# Patient Record
Sex: Male | Born: 2000 | Hispanic: Yes | Marital: Single | Smoking: Never smoker
Health system: Southern US, Community
[De-identification: ages and names within clinical notes are randomized; demographics above are authoritative.]

## PROBLEM LIST (undated history)

## (undated) DIAGNOSIS — Z789 Other specified health status: Secondary | ICD-10-CM

## (undated) HISTORY — PX: NO PAST SURGERIES: SHX2092

---
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
## 2020-11-07 ENCOUNTER — Encounter: Payer: Self-pay | Admitting: Family Medicine

## 2020-11-07 ENCOUNTER — Ambulatory Visit (INDEPENDENT_AMBULATORY_CARE_PROVIDER_SITE_OTHER): Payer: Medicaid Other | Admitting: Family Medicine

## 2020-11-07 ENCOUNTER — Other Ambulatory Visit: Payer: Self-pay

## 2020-11-07 VITALS — BP 119/73 | HR 71 | Ht 69.0 in | Wt 133.0 lb

## 2020-11-07 DIAGNOSIS — Z1159 Encounter for screening for other viral diseases: Secondary | ICD-10-CM

## 2020-11-07 DIAGNOSIS — Z114 Encounter for screening for human immunodeficiency virus [HIV]: Secondary | ICD-10-CM | POA: Diagnosis not present

## 2020-11-07 DIAGNOSIS — Z23 Encounter for immunization: Secondary | ICD-10-CM | POA: Diagnosis not present

## 2020-11-07 NOTE — Patient Instructions (Signed)
It was great meeting you today!  I am glad you are doing well!   Please follow up at your next scheduled appointment in 6 months, if anything arises between now and then, please don't hesitate to contact our office.   Thank you for allowing [place] to be a part of your medical care!  Thank you, Dr. Robyne Peers

## 2020-11-07 NOTE — Progress Notes (Signed)
    SUBJECTIVE:   CHIEF COMPLAINT / HPI:   Patient presents to the clinic in order to establish care. He is unsure of where he was seen by last or who his last PCP was, states that he will need to ask his mother. He is from [place]. Past medical history significant for seasonal allergies. Currently not taking any medications. Denies any surgeries or recent hospitalizations. Denies any drug allergies. Non-contributory family history. Eats a balanced diet and exercises daily by going to the gym. Denies illicit drug and tobacco use. Drinks about 2 cups of alcohol when going out about 2 times a week. Graduated high school a year ago and taking a break currently, long-term goals include wanting to be a Paediatric nurse. Lives with 2 older brothers who are 6 and 44 years old. Currently has a strong support system in place which includes his brothers and parents. Reports that he is sexually active with 2 male partners, uses protection which each and every encounter.   Denies any current concerns. Denies chest pain, dyspnea, palpitations, swelling, issues surrounding mood or any other symptoms.  OBJECTIVE:   BP 119/73   Pulse 71   Ht 5\' 9"  (1.753 m)   Wt 133 lb (60.3 kg)   SpO2 97%   BMI 19.64 kg/m   General: Patient well-appearing, in no acute distress. HEENT: normocephalic, atraumatic, PERRLA, non-tender thyroid, no cervical LAD, normal nasal cavity, no buccal ulcers or erythema CV: RRR, no murmurs or gallops auscultated Resp: CTAB, no wheezing, rales or rhonchi Abdomen: soft, non-tender, nondistended, presence of bowel sounds Ext: radial and distal pulses strong and equal bilaterally, no LE edema noted bilaterally, capillary refill less than 2 sec Neuro: normal gait Psych: mood appropriate   ASSESSMENT/PLAN:   Encounter for screening for HIV -pending HIV and Hep C screening, will notify patient of any abnormal results -safe sex practices discussed and encouraged to continue, consider  GC/Chlamydia testing at next visit.    -PHQ-9 score of 0 reviewed.   , DO [facility]

## 2020-11-07 NOTE — Assessment & Plan Note (Addendum)
-  pending HIV and Hep C screening, will notify patient of any abnormal results -safe sex practices discussed and encouraged to continue, consider GC/Chlamydia testing at next visit.

## 2020-11-08 LAB — HIV ANTIBODY (ROUTINE TESTING W REFLEX): HIV Screen 4th Generation wRfx: NONREACTIVE

## 2020-11-08 LAB — HCV INTERPRETATION

## 2020-11-08 LAB — HCV AB W REFLEX TO QUANT PCR: HCV Ab: <0.1 s/co ratio (ref 0.0–0.9)

## 2020-11-21 DIAGNOSIS — J342 Deviated nasal septum: Secondary | ICD-10-CM | POA: Diagnosis not present

## 2020-11-21 DIAGNOSIS — R519 Headache, unspecified: Secondary | ICD-10-CM | POA: Diagnosis not present

## 2020-11-21 DIAGNOSIS — J309 Allergic rhinitis, unspecified: Secondary | ICD-10-CM | POA: Diagnosis not present

## 2020-11-21 DIAGNOSIS — J343 Hypertrophy of nasal turbinates: Secondary | ICD-10-CM | POA: Diagnosis not present

## 2021-03-20 DIAGNOSIS — J342 Deviated nasal septum: Secondary | ICD-10-CM | POA: Diagnosis not present

## 2021-03-20 DIAGNOSIS — J343 Hypertrophy of nasal turbinates: Secondary | ICD-10-CM | POA: Diagnosis not present

## 2023-09-18 ENCOUNTER — Ambulatory Visit (INDEPENDENT_AMBULATORY_CARE_PROVIDER_SITE_OTHER): Admitting: Student

## 2023-09-18 ENCOUNTER — Encounter: Payer: Self-pay | Admitting: Student

## 2023-09-18 VITALS — BP 110/72 | HR 83 | Ht 71.0 in | Wt 140.8 lb

## 2023-09-18 DIAGNOSIS — K4090 Unilateral inguinal hernia, without obstruction or gangrene, not specified as recurrent: Secondary | ICD-10-CM | POA: Diagnosis not present

## 2023-09-18 DIAGNOSIS — Z23 Encounter for immunization: Secondary | ICD-10-CM | POA: Diagnosis not present

## 2023-09-18 NOTE — Patient Instructions (Signed)
 Groin Hernia (Inguinal Hernia) in Adults: What to Know  A hernia happens when an organ or tissue in your body pushes out through a weak spot in the muscles of your belly. This makes a bulge. A groin hernia is also called an inguinal hernia. It's found in your groin, which is the area where your leg meets your lower belly. This kind of hernia could also be: In your scrotum, if you're male. In the folds of skin around your vagina, if you're male. You may be able to push the bulge back into your belly. If you can't push it in and blood flow is cut off to the hernia, you'll need surgery right away. What are the causes? A groin hernia may happen when you strain your belly muscles, such as when you: Lift a heavy object. Strain to poop. Cough. What increases the risk? You may be more likely to get a groin hernia if: You're male. You're 50 years or older. You're pregnant. You've had a groin hernia or belly surgery before. You smoke. You're overweight. You work at a job where you need to stand a lot or lift heavy things. What are the signs or symptoms? Symptoms may depend on how big the hernia is. If it's small, you may not have symptoms. If it's bigger, you may have: A bulge near your groin or genitals. Pain or burning in your groin. A dull ache or feeling of pressure in your groin. If blood flow is cut off to the tissues inside the hernia, you may also: Feel pain and tenderness when you touch the bulge. The skin over it may turn red or purple. Have a fever. Throw up or feel like you may throw up. Have trouble pooping or passing gas. How is this treated? Treatment depends on how big the hernia is and what symptoms you have. You may need: To be watched to see if the bulge grows bigger. Surgery. This may be done if the hernia is big or if you have symptoms. Follow these instructions at home: Lifestyle Ask if it's OK for you to lift. Try not to stand for long periods of time. Do not  smoke, vape, or use nicotine or tobacco. Stay at a healthy weight. Try not to do things that put pressure on your hernia. Preventing trouble pooping You may need to take these steps to help prevent or treat trouble pooping (constipation): Take medicines to help you poop. Eat foods high in fiber, like beans, whole grains, and fresh fruits and vegetables. Drink more fluids as told. General instructions Try to push the hernia back in place by very gently pressing on it while lying down. Do not try to force it back in if it won't push in easily. Watch your hernia for any changes in: Shape. Size. Color. Take your medicines only as told. Contact a doctor if: You have a fever. You have new symptoms. Your symptoms get worse. You can't poop or pass gas. Get help right away if: Your bulge: Starts to hurt a lot. Changes color. You have sudden pain in your scrotum, or your scrotum changes size. You can't gently push the hernia back in place. You feel like you may vomit, and that feeling does not go away. You keep throwing up or feeling like you need to throw up. These symptoms may be an emergency. Call 911 right away. Do not wait to see if the symptoms will go away. Do not drive yourself to the hospital. This information is  not intended to replace advice given to you by your health care provider. Make sure you discuss any questions you have with your health care provider. Document Revised: 11/29/2022 Document Reviewed: 11/29/2022 Elsevier Patient Education  2024 [employer].

## 2023-09-18 NOTE — Telephone Encounter (Signed)
 Patient contacted clinic and expressed he will like to get a Gen surgery referral for inguinal hernia after his visit today. Referral placed for general surgery

## 2023-09-18 NOTE — Progress Notes (Signed)
    SUBJECTIVE:   CHIEF COMPLAINT / HPI:   23 year old male with no significant past medical history presenting due to concerns of lower abdominal lump.  This lump usually is present intermittently for the past 1 year he about once a month.  He said he noticed the lump more after eating heavy meal or while carrying boxes.  This usually present on the left side just above his scrotum and spontaneously resolve within minutes to an hour. Had one time episode of brief pain but for the most part it is usually painless.  PERTINENT  PMH / PSH: Reviewed   OBJECTIVE:   BP 110/72   Pulse 83   Ht 5\' 11"  (1.803 m)   Wt 140 lb 12.8 oz (63.9 kg)   SpO2 100%   BMI 19.64 kg/m    Physical Exam General: Alert, well appearing, NAD Cardiovascular: Regular rate, well-perfused Respiratory: Normal work of breathing on RA Pelvic exam: No notable inguinal mass,  no penile lesion,  normal testes, or hydrocele.  RN Alisa App served as Biomedical engineer for this exam.  ASSESSMENT/PLAN:   Inguinal hernia of left side without obstruction or gangrene No visible inguinal mass on exam.  Patient's presentation more consistent with non-incarcerated inguinal hernia.  No red flag symptoms at this time.  Via shared decision will monitor closely at this time. - Advised patient on avoiding constipation and maintain adequate hydration -Avoid heavy lifting -Consider general surgery referral if presentation is more frequent or painful - ED precaution discussed with patient     Goble Last, MD [facility]

## 2023-09-18 NOTE — Assessment & Plan Note (Addendum)
 No visible inguinal mass on exam.  Patient's presentation more consistent with non-incarcerated inguinal hernia.  No red flag symptoms at this time.  Via shared decision will monitor closely at this time. - Advised patient on avoiding constipation and maintain adequate hydration -Avoid heavy lifting -Consider general surgery referral if presentation is more frequent or painful - ED precaution discussed with patient

## 2023-11-17 ENCOUNTER — Other Ambulatory Visit: Payer: Self-pay

## 2023-11-17 ENCOUNTER — Encounter (HOSPITAL_COMMUNITY): Payer: Self-pay

## 2023-11-17 ENCOUNTER — Emergency Department (HOSPITAL_COMMUNITY)
Admission: EM | Admit: 2023-11-17 | Discharge: 2023-11-17 | Disposition: A | Discharge status: Home / Self Care | Attending: Emergency Medicine | Admitting: Emergency Medicine

## 2023-11-17 DIAGNOSIS — K4090 Unilateral inguinal hernia, without obstruction or gangrene, not specified as recurrent: Secondary | ICD-10-CM | POA: Insufficient documentation

## 2023-11-17 NOTE — ED Triage Notes (Signed)
 Patient arrives POV c/o left sided groin pain. Patient states he was evaluated at his PCP ~ 2 months ago and was told he has an inguinal hernia. Patient reports pain has now increased; states he does heavy lifting at work. Patient also states he has not heard back from referral for surgical tx.

## 2023-11-17 NOTE — Discharge Instructions (Addendum)
 Please make an appointment with the general surgery group listed.  Please return for new or worsening symptoms.

## 2023-11-17 NOTE — ED Provider Notes (Signed)
 [facility] Emergency Department Provider Note MRN:  [id number]  Arrival date & time: 11/17/23     Chief Complaint   Hernia   History of Present Illness   Troy Romero is a 23 y.o. year-old male presents to the ED with chief complaint of left inguinal hernia.  He states that he was having some pain tonight, but the symptoms have now resolved.  He states that he asked his doctor for referral, but has not been contacted by the surgery office yet.  He states that because he is feeling better, he would like to go home and follow-up outpatient.  He asks if I can give him contact information.  He denies fever, chills, nausea, vomiting.  States that he thinks he had more pain tonight because he had been drinking and felt bloated.  But again, he states that he feels better now..  History provided by patient.   Review of Systems  Pertinent positive and negative review of systems noted in HPI.    Physical Exam   Vitals:   11/17/23 0420  BP: 121/81  Pulse: 97  Resp: 16  Temp: 98.8 F (37.1 C)  SpO2: 99%    CONSTITUTIONAL:  non toxic-appearing, NAD NEURO:  Alert and oriented x 3, CN 3-12 grossly intact EYES:  eyes equal and reactive ENT/NECK:  Supple, no stridor  CARDIO:  normal rate, regular rhythm, appears well-perfused  PULM:  No respiratory distress,  GI/GU:  non-distended, normal BSx4, no palpable mass or hernia, no induration or erythema MSK/SPINE:  No gross deformities, no edema, moves all extremities  SKIN:  no rash, atraumatic   *Additional and/or pertinent findings included in MDM below  Diagnostic and Interventional Summary    EKG Interpretation Date/Time:    Ventricular Rate:    PR Interval:    QRS Duration:    QT Interval:    QTC Calculation:   R Axis:      Text Interpretation:         Labs Reviewed - No data to display  No orders to display    Medications - No data to display   Procedures  /  Critical  Care Procedures  ED Course and Medical Decision Making  I have reviewed the triage vital signs, the nursing notes, and pertinent available records from the EMR.  Social Determinants Affecting Complexity of Care: Patient has no clinically significant social determinants affecting this chief complaint..   ED Course:    Medical Decision Making Patient here with history of inguinal hernia.  He had some pain tonight, but his symptoms have resolved.  I do not see any worrisome findings on my exam.  Vital signs are stable.  He looks well.  I offered the patient advanced imaging versus discharge and referral.  Patient would like to be discharged with contact information for general surgery.  I will provide this to him.  Return precautions discussed.  Patient understands agrees with the plan.         Consultants: No consultations were needed in caring for this patient.   Treatment and Plan: Emergency department workup does not suggest an emergent condition requiring admission or immediate intervention beyond  what has been performed at this time. The patient is safe for discharge and has  been instructed to return immediately for worsening symptoms, change in  symptoms or any other concerns    Final Clinical Impressions(s) / ED Diagnoses     ICD-10-CM   1. Left inguinal hernia  K40.90       ED Discharge Orders     None         Discharge Instructions Discussed with and Provided to Patient:     Discharge Instructions      Please make an appointment with the general surgery group listed.       Vicky Charleston, PA-C 11/17/23 [id number]    Jerral Meth, MD 11/17/23 [phone]

## 2023-11-20 ENCOUNTER — Ambulatory Visit: Payer: Self-pay | Admitting: General Surgery

## 2023-11-20 DIAGNOSIS — K4090 Unilateral inguinal hernia, without obstruction or gangrene, not specified as recurrent: Secondary | ICD-10-CM | POA: Diagnosis not present

## 2023-12-14 NOTE — Progress Notes (Signed)
 Spanish Interpreter refused by patient. He signed an Oncologist Vaccine received:  []  No [x]  Yes Date of any COVID positive Test in last 90 days: none  PCP - Norleen April MD at [facility] Cardiologist - none  Chest x-ray - 04-17-2017  2v  Epic EKG -  04-18-2017  Epic   12-17-2023   Stress Test -  ECHO -  Cardiac Cath -  CT Coronary Calcium score:   Bowel Prep - [x]  No  []   Yes ______  Pacemaker / ICD device [x]  No []  Yes   Spinal Cord Stimulator:[x]  No []  Yes       History of Sleep Apnea? [x]  No []  Yes   CPAP used?- [x]  No []  Yes    Patient has: [x]  NO Hx DM   []  Pre-DM   []  DM1  []   DM2 Does the patient monitor blood sugar?   [x]  N/A   []  No []  Yes   Blood Thinner / Instructions:  none Aspirin Instructions:   None  ERAS Protocol Ordered: []  No  [x]  Yes PRE-SURGERY []  ENSURE  []  G2   [x]  No Drink Ordered Patient is to be NPO after: 1100  Dental hx: []  Dentures:  [x]  N/A      []  Bridge or Partial:                   []  Loose or Damaged teeth:   Activity level: Able to walk up 2 flights of stairs without becoming significantly short of breath or having chest pain?  []  No   [x]    Yes  Patient can perform ADLs without assistance. []  No   [x]   Yes  Anesthesia review: Vapes- some days, 2019 EKG- ? Right ventricular hypertrophy- had CP; will get repeat at PST  Patient denies any S&S of respiratory illness or Covid - no shortness of breath, fever, cough or chest pain at PAT appointment.  Patient verbalized understanding and agreement to the Pre-Surgical Instructions that were given to them at this PAT appointment. Patient was also educated of the need to review these PAT instructions again prior to his surgery.I reviewed the appropriate phone numbers to call if they have any and questions or concerns.

## 2023-12-14 NOTE — Patient Instructions (Signed)
 SURGICAL WAITING ROOM VISITATION Patients having surgery or a procedure may have no more than 2 support people in the waiting area - these visitors may rotate in the visitor waiting room.   If the patient needs to stay at the hospital during part of their recovery, the visitor guidelines for inpatient rooms apply.  PRE-OP VISITATION  Pre-op nurse will coordinate an appropriate time for 1 support person to accompany the patient in pre-op.  This support person may not rotate.  This visitor will be contacted when the time is appropriate for the visitor to come back in the pre-op area.  Please refer to the [facility] website for the visitor guidelines for Inpatients (after your surgery is over and you are in a regular room).  You are not required to quarantine at this time prior to your surgery. However, you must do this: Hand Hygiene often Do NOT share personal items Notify your provider if you are in close contact with someone who has COVID or you develop fever 100.4 or greater, new onset of sneezing, cough, sore throat, shortness of breath or body aches.  If you test positive for Covid or have been in contact with anyone that has tested positive in the last 10 days please notify you surgeon.    Your procedure is scheduled on:  MONDAY  December 30, 2023  Report to [facility] Main Entrance: [address] entrance where the [employer] is available.   Report to admitting at: 11:45 AM  Call this number if you have any questions or problems the morning of surgery [phone]  FOLLOW ANY ADDITIONAL PRE OP INSTRUCTIONS YOU RECEIVED FROM YOUR SURGEON'S OFFICE!!!  Do not eat food after Midnight the night prior to your surgery/procedure.  After Midnight you may have the following liquids until  11:00  AM DAY OF SURGERY  Clear Liquid Diet Water Black Coffee (sugar ok, NO MILK/CREAM OR CREAMERS)  Tea (sugar ok, NO MILK/CREAM OR CREAMERS) regular and decaf                              Plain Jell-O  with no fruit (NO RED)                                           Fruit ices (not with fruit pulp, NO RED)                                     Popsicles (NO RED)                                                                  Juice: NO CITRUS JUICES: only apple, WHITE grape, WHITE cranberry Sports drinks like Gatorade or Powerade (NO RED)                Oral Hygiene is also important to reduce your risk of infection.        Remember - BRUSH YOUR TEETH THE MORNING OF SURGERY WITH YOUR REGULAR TOOTHPASTE  Do NOT smoke after Midnight  the night before surgery.  STOP TAKING all Vitamins, Herbs and supplements 1 week before your surgery.   Take ONLY these medicines the morning of surgery with A SIP OF WATER: Flonase nasal spray if needed.                    You may not have any metal on your body including jewelry, and body piercing  Do not wear lotions, powders, cologne, or deodorant  Men may shave face and neck.  Contacts, Hearing Aids, dentures or bridgework may not be worn into surgery. DENTURES WILL BE REMOVED PRIOR TO SURGERY PLEASE DO NOT APPLY Poly grip OR ADHESIVES!!!  Patients discharged on the day of surgery will not be allowed to drive home.  Someone NEEDS to stay with you for the first 24 hours after anesthesia.  Do not bring your home medications to the hospital. The Pharmacy will dispense medications listed on your medication list to you during your admission in the Hospital.   Please read over the following fact sheets you were given: IF YOU HAVE QUESTIONS ABOUT YOUR PRE-OP INSTRUCTIONS, PLEASE CALL [phone]   [facility] - Preparing for Surgery Before surgery, you can play an important role.  Because skin is not sterile, your skin needs to be as free of germs as possible.  You can reduce the number of germs on your skin by washing with CHG (chlorahexidine gluconate) soap before surgery.  CHG is an antiseptic cleaner which kills germs and bonds with  the skin to continue killing germs even after washing. Please DO NOT use if you have an allergy to CHG or antibacterial soaps.  If your skin becomes reddened/irritated stop using the CHG and inform your nurse when you arrive at Short Stay. Do not shave (including legs and underarms) for at least 48 hours prior to the first CHG shower.  You may shave your face/neck.  Please follow these instructions carefully:  1.  Shower with CHG Soap the night before surgery and the  morning of surgery.  2.  If you choose to wash your hair, wash your hair first as usual with your normal  shampoo.  3.  After you shampoo, rinse your hair and body thoroughly to remove the shampoo.                             4.  Use CHG as you would any other liquid soap.  You can apply chg directly to the skin and wash.  Gently with a scrungie or clean washcloth.  5.  Apply the CHG Soap to your body ONLY FROM THE NECK DOWN.   Do not use on face/ open                           Wound or open sores. Avoid contact with eyes, ears mouth and genitals (private parts).                       Wash face,  Genitals (private parts) with your normal soap.             6.  Wash thoroughly, paying special attention to the area where your  surgery  will be performed.  7.  Thoroughly rinse your body with warm water from the neck down.  8.  DO NOT shower/wash with your normal soap after using and rinsing off the  CHG Soap.            9.  Pat yourself dry with a clean towel.            10.  Wear clean pajamas.            11.  Place clean sheets on your bed the night of your first shower and do not  sleep with pets.  ON THE DAY OF SURGERY : Do not apply any lotions/deodorants the morning of surgery.  Please wear clean clothes to the hospital/surgery center.   FAILURE TO FOLLOW THESE INSTRUCTIONS MAY RESULT IN THE CANCELLATION OF YOUR SURGERY  PATIENT SIGNATURE_________________________________  NURSE  SIGNATURE__________________________________  ________________________________________________________________________

## 2023-12-17 ENCOUNTER — Encounter (HOSPITAL_COMMUNITY): Payer: Self-pay

## 2023-12-17 ENCOUNTER — Other Ambulatory Visit: Payer: Self-pay

## 2023-12-17 ENCOUNTER — Encounter (HOSPITAL_COMMUNITY)
Admission: RE | Admit: 2023-12-17 | Discharge: 2023-12-17 | Disposition: A | Discharge status: Home / Self Care | Source: Ambulatory Visit | Attending: General Surgery | Admitting: General Surgery

## 2023-12-17 VITALS — BP 115/73 | HR 64 | Temp 97.6°F | Resp 14 | Ht 71.0 in | Wt 140.0 lb

## 2023-12-17 DIAGNOSIS — R9431 Abnormal electrocardiogram [ECG] [EKG]: Secondary | ICD-10-CM | POA: Diagnosis not present

## 2023-12-17 DIAGNOSIS — K4090 Unilateral inguinal hernia, without obstruction or gangrene, not specified as recurrent: Secondary | ICD-10-CM | POA: Diagnosis not present

## 2023-12-17 DIAGNOSIS — Z01818 Encounter for other preprocedural examination: Secondary | ICD-10-CM | POA: Insufficient documentation

## 2023-12-17 HISTORY — DX: Other specified health status: Z78.9

## 2023-12-17 LAB — BASIC METABOLIC PANEL WITH GFR
Anion gap: 11 (ref 5–15)
BUN: 16 mg/dL (ref 6–20)
CO2: 24 mmol/L (ref 22–32)
Calcium: 9.2 mg/dL (ref 8.9–10.3)
Chloride: 105 mmol/L (ref 98–111)
Creatinine, Ser: 0.81 mg/dL (ref 0.61–1.24)
GFR, Estimated: >60 mL/min (ref >60)
Glucose, Bld: 88 mg/dL (ref 70–99)
Potassium: 4.4 mmol/L (ref 3.5–5.1)
Sodium: 139 mmol/L (ref 135–145)

## 2023-12-17 LAB — CBC
HCT: 45.3 % (ref 39.0–52.0)
Hemoglobin: 15.3 g/dL (ref 13.0–17.0)
MCH: 30.4 pg (ref 26.0–34.0)
MCHC: 33.8 g/dL (ref 30.0–36.0)
MCV: 90.1 fL (ref 80.0–100.0)
Platelets: 270 K/uL (ref 150–400)
RBC: 5.03 MIL/uL (ref 4.22–5.81)
RDW: 12.3 % (ref 11.5–15.5)
WBC: 5 K/uL (ref 4.0–10.5)
nRBC: 0 % (ref 0.0–0.2)

## 2023-12-29 NOTE — Anesthesia Preprocedure Evaluation (Addendum)
 Anesthesia Evaluation  Patient identified by MRN, date of birth, ID band Patient awake    Reviewed: Allergy & Precautions, NPO status , Patient's Chart, lab work & pertinent test results  Airway Mallampati: I  TM Distance: >3 FB Neck ROM: Full    Dental no notable dental hx. (+) Dental Advisory Given, Teeth Intact   Pulmonary neg pulmonary ROS   Pulmonary exam normal breath sounds clear to auscultation       Cardiovascular negative cardio ROS Normal cardiovascular exam Rhythm:Regular Rate:Normal     Neuro/Psych negative neurological ROS     GI/Hepatic negative GI ROS, Neg liver ROS,,,  Endo/Other  negative endocrine ROS    Renal/GU negative Renal ROS     Musculoskeletal negative musculoskeletal ROS (+)    Abdominal   Peds  Hematology negative hematology ROS (+)   Anesthesia Other Findings   Reproductive/Obstetrics                              Anesthesia Physical Anesthesia Plan  ASA: 2  Anesthesia Plan: General   Post-op Pain Management: Tylenol  PO (pre-op)*, Gabapentin  PO (pre-op)* and Regional block*   Induction: Intravenous  PONV Risk Score and Plan: 3 and Ondansetron , Dexamethasone , Treatment may vary due to age or medical condition and Midazolam   Airway Management Planned: Oral ETT  Additional Equipment: None  Intra-op Plan:   Post-operative Plan: Extubation in OR  Informed Consent: I have reviewed the patients History and Physical, chart, labs and discussed the procedure including the risks, benefits and alternatives for the proposed anesthesia with the patient or authorized representative who has indicated his/her understanding and acceptance.     Dental advisory given  Plan Discussed with: CRNA  Anesthesia Plan Comments:          Anesthesia Quick Evaluation

## 2023-12-30 ENCOUNTER — Ambulatory Visit (HOSPITAL_BASED_OUTPATIENT_CLINIC_OR_DEPARTMENT_OTHER): Payer: Self-pay | Admitting: Certified Registered Nurse Anesthetist

## 2023-12-30 ENCOUNTER — Encounter (HOSPITAL_COMMUNITY): Payer: Self-pay | Admitting: General Surgery

## 2023-12-30 ENCOUNTER — Ambulatory Visit (HOSPITAL_COMMUNITY): Payer: Self-pay | Admitting: Certified Registered Nurse Anesthetist

## 2023-12-30 ENCOUNTER — Other Ambulatory Visit: Payer: Self-pay

## 2023-12-30 ENCOUNTER — Ambulatory Visit (HOSPITAL_COMMUNITY)
Admission: RE | Admit: 2023-12-30 | Discharge: 2023-12-30 | Disposition: A | Discharge status: Home / Self Care | Attending: General Surgery | Admitting: General Surgery

## 2023-12-30 ENCOUNTER — Encounter (HOSPITAL_COMMUNITY)
Admission: RE | Disposition: A | Discharge status: Home / Self Care | Payer: Self-pay | Source: Home / Self Care | Attending: General Surgery

## 2023-12-30 DIAGNOSIS — K4090 Unilateral inguinal hernia, without obstruction or gangrene, not specified as recurrent: Secondary | ICD-10-CM | POA: Insufficient documentation

## 2023-12-30 DIAGNOSIS — G8918 Other acute postprocedural pain: Secondary | ICD-10-CM | POA: Diagnosis not present

## 2023-12-30 HISTORY — PX: XI ROBOTIC ASSISTED INGUINAL HERNIA REPAIR WITH MESH: SHX6706

## 2023-12-30 SURGERY — REPAIR, HERNIA, INGUINAL, ROBOT-ASSISTED, LAPAROSCOPIC, USING MESH
Anesthesia: General | Site: Inguinal | Laterality: Left

## 2023-12-30 MED ORDER — CEFAZOLIN SODIUM-DEXTROSE 2-4 GM/100ML-% IV SOLN
2.0000 g | INTRAVENOUS | Status: AC
  Administered 2023-12-30: 2 g via INTRAVENOUS
  Filled 2023-12-30: qty 100

## 2023-12-30 MED ORDER — GABAPENTIN 300 MG PO CAPS
300.0000 mg | ORAL_CAPSULE | ORAL | Status: AC
  Administered 2023-12-30: 300 mg via ORAL
  Filled 2023-12-30: qty 1

## 2023-12-30 MED ORDER — OXYCODONE HCL 5 MG PO TABS
ORAL_TABLET | ORAL | Status: AC
  Filled 2023-12-30: qty 1

## 2023-12-30 MED ORDER — LACTATED RINGERS IV SOLN: INTRAVENOUS | Status: DC | PRN

## 2023-12-30 MED ORDER — ACETAMINOPHEN 500 MG PO TABS
1000.0000 mg | ORAL_TABLET | ORAL | Status: AC
  Administered 2023-12-30: 1000 mg via ORAL
  Filled 2023-12-30: qty 2

## 2023-12-30 MED ORDER — LACTATED RINGERS IV SOLN: INTRAVENOUS | Status: DC

## 2023-12-30 MED ORDER — ROCURONIUM BROMIDE 100 MG/10ML IV SOLN
INTRAVENOUS | Status: DC | PRN
  Administered 2023-12-30: 70 mg via INTRAVENOUS
  Administered 2023-12-30: 20 mg via INTRAVENOUS
  Administered 2023-12-30: 10 mg via INTRAVENOUS

## 2023-12-30 MED ORDER — ROPIVACAINE HCL 5 MG/ML IJ SOLN
INTRAMUSCULAR | Status: DC | PRN
Start: 2023-12-30 — End: 2023-12-30
  Administered 2023-12-30: 30 mL via PERINEURAL

## 2023-12-30 MED ORDER — SUGAMMADEX SODIUM 200 MG/2ML IV SOLN
INTRAVENOUS | Status: AC
  Filled 2023-12-30: qty 2

## 2023-12-30 MED ORDER — CHLORHEXIDINE GLUCONATE 0.12 % MT SOLN
15.0000 mL | Freq: Once | OROMUCOSAL | Status: AC
  Administered 2023-12-30: 15 mL via OROMUCOSAL

## 2023-12-30 MED ORDER — DEXAMETHASONE SODIUM PHOSPHATE 10 MG/ML IJ SOLN
INTRAMUSCULAR | Status: AC
  Filled 2023-12-30: qty 1

## 2023-12-30 MED ORDER — ORAL CARE MOUTH RINSE: 15.0000 mL | Freq: Once | OROMUCOSAL | Status: AC

## 2023-12-30 MED ORDER — OXYCODONE HCL 5 MG/5ML PO SOLN: 5.0000 mg | Freq: Once | ORAL | Status: AC | PRN

## 2023-12-30 MED ORDER — CLONIDINE HCL (ANALGESIA) 100 MCG/ML EP SOLN
EPIDURAL | Status: DC | PRN
Start: 2023-12-30 — End: 2023-12-30
  Administered 2023-12-30: 80 ug

## 2023-12-30 MED ORDER — ROCURONIUM BROMIDE 10 MG/ML (PF) SYRINGE
PREFILLED_SYRINGE | INTRAVENOUS | Status: AC
  Filled 2023-12-30: qty 10

## 2023-12-30 MED ORDER — HYDROMORPHONE HCL 1 MG/ML IJ SOLN: 0.2500 mg | INTRAMUSCULAR | Status: DC | PRN

## 2023-12-30 MED ORDER — PROPOFOL 10 MG/ML IV BOLUS
INTRAVENOUS | Status: DC | PRN
  Administered 2023-12-30: 200 mg via INTRAVENOUS

## 2023-12-30 MED ORDER — FENTANYL CITRATE PF 50 MCG/ML IJ SOSY
100.0000 ug | PREFILLED_SYRINGE | INTRAMUSCULAR | Status: DC
  Filled 2023-12-30: qty 2

## 2023-12-30 MED ORDER — DROPERIDOL 2.5 MG/ML IJ SOLN: 0.6250 mg | Freq: Once | INTRAMUSCULAR | Status: DC | PRN

## 2023-12-30 MED ORDER — GABAPENTIN 300 MG PO CAPS
300.0000 mg | ORAL_CAPSULE | Freq: Once | ORAL | Status: DC
Start: 2023-12-30 — End: 2023-12-30

## 2023-12-30 MED ORDER — CHLORHEXIDINE GLUCONATE CLOTH 2 % EX PADS: 6.0000 | MEDICATED_PAD | Freq: Once | CUTANEOUS | Status: DC

## 2023-12-30 MED ORDER — MIDAZOLAM HCL 2 MG/2ML IJ SOLN
INTRAMUSCULAR | Status: AC
  Filled 2023-12-30: qty 2

## 2023-12-30 MED ORDER — ACETAMINOPHEN 500 MG PO TABS: 1000.0000 mg | ORAL_TABLET | Freq: Once | ORAL | Status: DC

## 2023-12-30 MED ORDER — DEXMEDETOMIDINE HCL IN NACL 80 MCG/20ML IV SOLN
INTRAVENOUS | Status: DC | PRN
  Administered 2023-12-30: 8 ug via INTRAVENOUS
  Administered 2023-12-30 (×2): 4 ug via INTRAVENOUS

## 2023-12-30 MED ORDER — ONDANSETRON HCL 4 MG/2ML IJ SOLN
INTRAMUSCULAR | Status: AC
  Filled 2023-12-30: qty 2

## 2023-12-30 MED ORDER — BUPIVACAINE-EPINEPHRINE (PF) 0.25% -1:200000 IJ SOLN
INTRAMUSCULAR | Status: AC
  Filled 2023-12-30: qty 30

## 2023-12-30 MED ORDER — BUPIVACAINE-EPINEPHRINE 0.25% -1:200000 IJ SOLN
INTRAMUSCULAR | Status: DC | PRN
  Administered 2023-12-30: 30 mL

## 2023-12-30 MED ORDER — ONDANSETRON HCL 4 MG/2ML IJ SOLN
INTRAMUSCULAR | Status: DC | PRN
  Administered 2023-12-30: 4 mg via INTRAVENOUS

## 2023-12-30 MED ORDER — ACETAMINOPHEN 325 MG PO TABS: 650.0000 mg | ORAL_TABLET | Freq: Four times a day (QID) | ORAL | 0 refills | Status: AC

## 2023-12-30 MED ORDER — MIDAZOLAM HCL 5 MG/5ML IJ SOLN
INTRAMUSCULAR | Status: DC | PRN
  Administered 2023-12-30: 2 mg via INTRAVENOUS

## 2023-12-30 MED ORDER — DEXMEDETOMIDINE HCL IN NACL 80 MCG/20ML IV SOLN
INTRAVENOUS | Status: AC
  Filled 2023-12-30: qty 20

## 2023-12-30 MED ORDER — 0.9 % SODIUM CHLORIDE (POUR BTL) OPTIME
TOPICAL | Status: DC | PRN
  Administered 2023-12-30: 1000 mL

## 2023-12-30 MED ORDER — CELECOXIB 200 MG PO CAPS
200.0000 mg | ORAL_CAPSULE | ORAL | Status: AC
  Administered 2023-12-30: 200 mg via ORAL
  Filled 2023-12-30: qty 1

## 2023-12-30 MED ORDER — OXYCODONE HCL 5 MG PO TABS: 5.0000 mg | ORAL_TABLET | Freq: Three times a day (TID) | ORAL | 0 refills | Status: AC | PRN

## 2023-12-30 MED ORDER — FENTANYL CITRATE (PF) 100 MCG/2ML IJ SOLN
INTRAMUSCULAR | Status: AC
  Filled 2023-12-30: qty 2

## 2023-12-30 MED ORDER — DEXAMETHASONE SODIUM PHOSPHATE 10 MG/ML IJ SOLN
INTRAMUSCULAR | Status: DC | PRN
  Administered 2023-12-30: 5 mg via INTRAVENOUS

## 2023-12-30 MED ORDER — IBUPROFEN 200 MG PO TABS: 600.0000 mg | ORAL_TABLET | Freq: Four times a day (QID) | ORAL | 0 refills | Status: AC

## 2023-12-30 MED ORDER — OXYCODONE HCL 5 MG PO TABS
5.0000 mg | ORAL_TABLET | Freq: Once | ORAL | Status: AC | PRN
  Administered 2023-12-30: 5 mg via ORAL

## 2023-12-30 MED ORDER — FENTANYL CITRATE (PF) 100 MCG/2ML IJ SOLN
INTRAMUSCULAR | Status: DC | PRN
  Administered 2023-12-30 (×4): 50 ug via INTRAVENOUS

## 2023-12-30 MED ORDER — DEXAMETHASONE SODIUM PHOSPHATE 4 MG/ML IJ SOLN
INTRAMUSCULAR | Status: DC | PRN
  Administered 2023-12-30: 5 mg via PERINEURAL

## 2023-12-30 MED ORDER — LIDOCAINE HCL (PF) 2 % IJ SOLN
INTRAMUSCULAR | Status: AC
  Filled 2023-12-30: qty 5

## 2023-12-30 MED ORDER — SUGAMMADEX SODIUM 200 MG/2ML IV SOLN
INTRAVENOUS | Status: DC | PRN
  Administered 2023-12-30: 200 mg via INTRAVENOUS

## 2023-12-30 MED ORDER — MIDAZOLAM HCL 2 MG/2ML IJ SOLN
2.0000 mg | INTRAMUSCULAR | Status: DC
  Filled 2023-12-30: qty 2

## 2023-12-30 MED ORDER — LIDOCAINE HCL (CARDIAC) PF 100 MG/5ML IV SOSY
PREFILLED_SYRINGE | INTRAVENOUS | Status: DC | PRN
  Administered 2023-12-30: 50 mg via INTRAVENOUS

## 2023-12-30 MED ORDER — PROPOFOL 10 MG/ML IV BOLUS
INTRAVENOUS | Status: AC
  Filled 2023-12-30: qty 20

## 2023-12-30 SURGICAL SUPPLY — 49 items
APPLICATOR COTTON TIP 6 STRL (MISCELLANEOUS) IMPLANT
BAG COUNTER SPONGE SURGICOUNT (BAG) IMPLANT
BLADE SURG SZ11 CARB STEEL (BLADE) ×1 IMPLANT
CHLORAPREP W/TINT 26 (MISCELLANEOUS) ×1 IMPLANT
COVER MAYO STAND STRL (DRAPES) ×1 IMPLANT
COVER SURGICAL LIGHT HANDLE (MISCELLANEOUS) ×1 IMPLANT
COVER TIP SHEARS 8 DVNC (MISCELLANEOUS) ×1 IMPLANT
DEFOGGER SCOPE WARM SEASHARP (MISCELLANEOUS) IMPLANT
DERMABOND ADVANCED .7 DNX12 (GAUZE/BANDAGES/DRESSINGS) ×1 IMPLANT
DRAPE ARM DVNC X/XI (DISPOSABLE) ×3 IMPLANT
DRAPE COLUMN DVNC XI (DISPOSABLE) ×1 IMPLANT
DRIVER NDL LRG 8 DVNC XI (INSTRUMENTS) IMPLANT
DRIVER NDL MEGA SUTCUT DVNCXI (INSTRUMENTS) ×1 IMPLANT
DRIVER NDLE LRG 8 DVNC XI (INSTRUMENTS) ×1 IMPLANT
DRIVER NDLE MEGA SUTCUT DVNCXI (INSTRUMENTS) IMPLANT
ELECT REM PT RETURN 15FT ADLT (MISCELLANEOUS) ×1 IMPLANT
FORCEPS BPLR 8 MD DVNC XI (FORCEP) ×1 IMPLANT
GAUZE 4X4 16PLY ~~LOC~~+RFID DBL (SPONGE) ×1 IMPLANT
GLOVE BIO SURGEON STRL SZ7 (GLOVE) ×2 IMPLANT
GOWN STRL REUS W/ TWL XL LVL3 (GOWN DISPOSABLE) ×2 IMPLANT
IRRIGATION SUCT STRKRFLW 2 WTP (MISCELLANEOUS) IMPLANT
KIT BASIN OR (CUSTOM PROCEDURE TRAY) ×1 IMPLANT
KIT TURNOVER KIT A (KITS) ×1 IMPLANT
MARKER SKIN DUAL TIP RULER LAB (MISCELLANEOUS) ×1 IMPLANT
MESH 3DMAX MID 5X7 LT XLRG (Mesh General) IMPLANT
NDL HYPO 22X1.5 SAFETY MO (MISCELLANEOUS) ×1 IMPLANT
NDL INSUFFLATION 14GA 120MM (NEEDLE) ×1 IMPLANT
NEEDLE HYPO 22X1.5 SAFETY MO (MISCELLANEOUS) ×1 IMPLANT
NEEDLE INSUFFLATION 14GA 120MM (NEEDLE) ×1 IMPLANT
OBTURATOR OPTICALSTD 8 DVNC (TROCAR) ×1 IMPLANT
PACK CARDIOVASCULAR III (CUSTOM PROCEDURE TRAY) ×1 IMPLANT
SCISSORS MNPLR CVD DVNC XI (INSTRUMENTS) ×1 IMPLANT
SEAL UNIV 5-12 XI (MISCELLANEOUS) ×3 IMPLANT
SOLUTION ANTFG W/FOAM PAD STRL (MISCELLANEOUS) ×1 IMPLANT
SOLUTION ELECTROSURG ANTI STCK (MISCELLANEOUS) ×1 IMPLANT
SPIKE FLUID TRANSFER (MISCELLANEOUS) ×1 IMPLANT
SUT MNCRL AB 4-0 PS2 18 (SUTURE) ×1 IMPLANT
SUT STRATA PDS 2-0 23 CT-1 (SUTURE) IMPLANT
SUT STRATAFIX SPIRAL PDS3-0 (SUTURE) ×1 IMPLANT
SUT VIC AB 2-0 SH 27X BRD (SUTURE) IMPLANT
SUT VIC AB 3-0 SH 27X BRD (SUTURE) ×1 IMPLANT
SUT VLOC 180 3-0 9IN GS21 (SUTURE) IMPLANT
SYR 10ML LL (SYRINGE) ×1 IMPLANT
SYR 20ML LL LF (SYRINGE) ×1 IMPLANT
TAPE STRIPS DRAPE STRL (GAUZE/BANDAGES/DRESSINGS) ×1 IMPLANT
TOWEL GREEN STERILE FF (TOWEL DISPOSABLE) ×1 IMPLANT
TOWEL OR 17X26 10 PK STRL BLUE (TOWEL DISPOSABLE) ×1 IMPLANT
TROCAR Z-THREAD OPTICAL 5X100M (TROCAR) IMPLANT
TUBING INSUFFLATION 10FT LAP (TUBING) ×1 IMPLANT

## 2023-12-30 NOTE — Op Note (Signed)
 Troy Romero ([id number])  Operative Report   Date 12/30/2023  PREOPERATIVE DIAGNOSIS: Left Inguinal hernia  POSTOPERATIVE DIAGNOSIS: Same  PROCEDURE:  Robotic Left Inguinal Hernia Repair with Mesh (Bard 3D Midweight XL Left-sided Polypropylene Mesh)   SURGEON: Cordella Idler, MD  ANESTHESIA: General Anesthesia    INTRAOPERATIVE FINDINGS: Large left indirect inguinal hernia  IMPLANTS: Implant Name Type Inv. Item Serial No. Manufacturer Lot No. LRB No. Used Action  MESH 3DMAX MID 5X7 LT MARSHA - [id number] Mesh General MESH 3DMAX MID 5X7 LT XLRG  [employer] BARD ACCESS [id number] Left 1 Implanted    ESTIMATED BLOOD LOSS: Minimal  COMPLICATIONS: None  SPECIMENS: None  OPERATIVE INDICATIONS: Pt is a 23 y.o. male who presents with a left inguinal hernia.  The patient desires definitive repair.  The procedure's risks, benefits, and alternatives were explained to the patient.  Risks, including the risks of bleeding, infection, need for mesh removal, and potential for hernia recurrence, were discussed.  The patient agreed to proceed and signed informed consent in front of a witness.   DESCRIPTION OF PROCEDURE: After preoperatively identifying the patient in holding, the patient was brought to the operating room and placed supine on the operating room table.  Both arms were tucked and padded to avoid potential nerve injury.  Sequential Compression Devices were placed bilaterally.  After induction of general anesthesia, the patient was given the appropriate perioperative antibiotics.  The abdomen was prepped and draped in a typical sterile fashion.  A JACHO approved time out, where the name of the patient, the operation, and the intended site were confirmed. The abdomen was accessed with a Veress needle via the standard drop technique in the LUQ at Palmer's point.  Insufflation was established.  An 8 mm optical port was placed under direct visualization in the RLQ.  A camera was introduced  into the abdomen, and a thorough inspection of the abdomen was performed to confirm there was no additional pathology.  Two additional 8 mm robotic ports were placed laterally under direct visualization, one superior to the umbilicus and another in the RLQ. The patient was then placed into 15-degree Trendelenburg position to facilitate examination of the bilateral inguinal spaces.  A thorough inspection of the abdomen was undertaken.  A large, left indirect inguinal hernia was identified and amenable to robotic repair.       The [employer] robot was brought into the field and docked.  An 8 mm 30-degree scope was placed in the mid-abdominal port.  The peritoneum was taken down 6 cm superior to the hernia from the anterior abdominal wall, and dissection was taken inferiorly towards the hernia.  Medial to the epigastric vessels, the parietal compartment was dissected and completed to visualize the rectus muscle.  This dissection was carried down to the symphysis pubis and obturator foramen.  The retropubic space was dissected to expose at least 2 cm contralateral to the midline.  Cooper's ligament was exposed and cleared at least 2 cm below the ligament to ensure adequate space for the inferior border of the mesh.  Hesselbach's triangle was cleared, assessing for a direct hernia. There was no evidence of a direct or femoral hernia.   Lateral to the epigastric vessels, the dissection was carried out into the visceral compartment, continuing in the true preperitoneal plane.  The indirect hernia sac was carefully reduced and separated from the cord structures with medial retraction and a combination of blunt/sharp dissection and focused cautery.  This dissection continued until the cord  structures were parietalized completely, allowing for continuous visualization of the reflected peritoneum with the line originating 2 cm below Cooper's medially and across the psoas muscle in the lateral compartment.  The internal ring  was interrogated for a cord lipoma.  Any component of cord lipoma was reduced to the retroperitoneum and seated dorsal to the preperitoneal mesh.  Having achieved a complete dissection with a critical view of the entire myopectineal orifice, a piece of Bard 3D Midweight XL Left-sided Polypropylene Mesh was introduced into the field.  It was centered at the iliopubic tract, with the medial side crossing the midline and the inferior edge positioned 2 cm below Cooper's ligament.  With complete coverage of the myopectineal orifice, the inferior edge of the peritoneum was posterior and inferior to the mesh.  The lateral aspect of the mesh extended 3-5 cm beyond the lateral edge of the psoas.  Cephalad retraction of the peritoneal flap did not cause lifting of the inferior mesh edge or cord structures.  The mesh was fixated using an interrupted 3-0 Vicryl suture placed to the ipsilateral Coopers ligament.  The peritoneal flap was closed with a running 3-0 Stratafix barbed suture.  A hole in the peritoneum was closed.  Hemostasis was assured in the entirety of the abdomen.  The two lateral ports were removed under direct visualization.  The abdomen was desufflated under direct visualization.  The port sites were closed, local anesthetic was infiltrated, and Dermabond was applied.  After confirming twice that the sponge, needle, and instrument counts were correct, the procedure was terminated, the patient was extubated and transferred to the recovery room in stable condition.     DISPOSITION: Stable to PACU.   Electronically Signed By: Cordella DELENA Idler 12/30/2023 3:48 PM

## 2023-12-30 NOTE — Anesthesia Postprocedure Evaluation (Signed)
 Anesthesia Post Note  Patient: Troy Romero  Procedure(s) Performed: REPAIR, HERNIA, INGUINAL, ROBOT-ASSISTED, LAPAROSCOPIC, USING MESH (Left: Inguinal)     Patient location during evaluation: PACU Anesthesia Type: General Level of consciousness: sedated and patient cooperative Pain management: pain level controlled Vital Signs Assessment: post-procedure vital signs reviewed and stable Respiratory status: spontaneous breathing Cardiovascular status: stable Anesthetic complications: no   No notable events documented.  Last Vitals:  Vitals:   12/30/23 1645 12/30/23 1700  BP: 126/83   Pulse: 73   Resp: 14   Temp:  (!) 36.4 C  SpO2: 100%     Last Pain:  Vitals:   12/30/23 1700  TempSrc:   PainSc: 2                  Norleen Pope

## 2023-12-30 NOTE — Discharge Instructions (Signed)

## 2023-12-30 NOTE — H&P (Signed)
     Troy Romero [birth year]  [id number].    HPI:  23 y/o M w/ a left inguinal hernia presenting for elective repair. He reports that he is in his usual state of health and denies any recent changes in medications.   ROS: Review of Systems  Constitutional: Negative.   HENT: Negative.    Eyes: Negative.   Respiratory: Negative.    Cardiovascular: Negative.   Gastrointestinal: Negative.   Genitourinary: Negative.   Musculoskeletal: Negative.   Skin: Negative.   Neurological: Negative.   Endo/Heme/Allergies: Negative.   Psychiatric/Behavioral: Negative.      History reviewed. No pertinent family history.  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 He has never used smokeless tobacco. He reports current alcohol use of about 3.0 standard drinks of alcohol per week. He reports that he does not use drugs.  Allergies: No Known Allergies  Medications Prior to Admission  Medication Sig Dispense Refill   fluticasone (FLONASE) 50 MCG/ACT nasal spray Place 2 sprays into both nostrils daily as needed for allergies.      Physical Exam: Blood pressure 115/66, pulse 84, temperature 98 F (36.7 C), temperature source Oral, resp. rate 16, height 5' 11 (1.803 m), weight 63.5 kg, SpO2 100%. Gen: male, NAD Abd: soft, non-distended, left groin marked  No results found for this or any previous visit (from the past 48 hours). No results found.  Assessment/Plan 23 y/o M w/ a left inguinal hernia.  - Will proceed to the OR. We discussed the alternatives and potential risks of surgery, including but not limited to: bleeding, infection, damage to bowel or surrounding structures, damage to the vas/cord, mesh complications, chronic pain, recurrent  hernia, and need for additional procedures. All questions were addressed and consent was obtained.    Cordella DELENA Polly Marlis [facility]on Surgery 12/30/2023, 1:02 PM Please see Amion for pager number during day hours 7:00am-4:30pm or 7:00am -11:30am on weekends

## 2023-12-30 NOTE — Transfer of Care (Signed)
 Immediate Anesthesia Transfer of Care Note  Patient: Monia Glennette Elder  Procedure(s) Performed: Procedure(s) with comments: REPAIR, HERNIA, INGUINAL, ROBOT-ASSISTED, LAPAROSCOPIC, USING MESH (Left) - ROBOTIC LEFT INGUINAL HERNIA REPAIR WITH MESH - GEN AND TAP BLOCK  Patient Location: PACU  Anesthesia Type:General  Level of Consciousness:  sedated, patient cooperative and responds to stimulation  Airway & Oxygen Therapy:Patient Spontanous Breathing and Patient connected to face mask oxgen  Post-op Assessment:  Report given to PACU RN and Post -op Vital signs reviewed and stable  Post vital signs:  Reviewed and stable  Last Vitals:  Vitals:   12/30/23 1330 12/30/23 1335  BP: (P) 113/68 (P) 132/71  Pulse:    Resp:    Temp:    SpO2:      Complications: No apparent anesthesia complications

## 2023-12-30 NOTE — Anesthesia Procedure Notes (Signed)
 Procedure Name: Intubation Date/Time: 12/30/2023 1:56 PM  Performed by: Buster Catheryn SAUNDERS, CRNAPre-anesthesia Checklist: Patient identified, Emergency Drugs available, Suction available and Patient being monitored Patient Re-evaluated:Patient Re-evaluated prior to induction Oxygen Delivery Method: Circle system utilized Preoxygenation: Pre-oxygenation with 100% oxygen Induction Type: IV induction Ventilation: Mask ventilation without difficulty Laryngoscope Size: Miller and 2 Grade View: Grade II Tube type: Oral Tube size: 7.0 mm Number of attempts: 1 Airway Equipment and Method: Stylet and Oral airway Placement Confirmation: ETT inserted through vocal cords under direct vision, positive ETCO2 and breath sounds checked- equal and bilateral Secured at: 22 cm Tube secured with: Tape Dental Injury: Teeth and Oropharynx as per pre-operative assessment

## 2023-12-30 NOTE — Anesthesia Procedure Notes (Signed)
 Anesthesia Regional Block: TAP block   Pre-Anesthetic Checklist: , timeout performed,  Correct Patient, Correct Site, Correct Laterality,  Correct Procedure, Correct Position, site marked,  Risks and benefits discussed,  Surgical consent,  Pre-op evaluation,  At surgeon's request and post-op pain management  Laterality: Left  Prep: chloraprep       Needles:  Injection technique: Single-shot  Needle Type: Echogenic Stimulator Needle      Needle Gauge: 21     Additional Needles:   Procedures:,,,, ultrasound used (permanent image in chart),,    Narrative:  Start time: 12/30/2023 12:58 PM End time: 12/30/2023 1:18 PM Injection made incrementally with aspirations every 5 mL.  Performed by: Personally  Anesthesiologist: Darlyn Rush, MD  Additional Notes: BP cuff, SpO2 and EKG monitors applied. Sedation begun.  Anesthetic injected incrementally, slowly, and after neg aspirations under direct ultrasound guidance. Good fascial spread noted. Patient tolerated well.

## 2023-12-31 ENCOUNTER — Encounter (HOSPITAL_COMMUNITY): Payer: Self-pay | Admitting: General Surgery
